# Patient Record
Sex: Male | Born: 1980
Health system: Southern US, Community
[De-identification: ages and names within clinical notes are randomized; demographics above are authoritative.]

## PROBLEM LIST (undated history)

## (undated) DIAGNOSIS — Z87828 Personal history of other (healed) physical injury and trauma: Secondary | ICD-10-CM

## (undated) DIAGNOSIS — Z8669 Personal history of other diseases of the nervous system and sense organs: Secondary | ICD-10-CM

## (undated) HISTORY — DX: Personal history of other diseases of the nervous system and sense organs: Z86.69

## (undated) HISTORY — DX: Personal history of other (healed) physical injury and trauma: Z87.828

## (undated) HISTORY — PX: BACK SURGERY: SHX140

---

## 2020-02-11 ENCOUNTER — Encounter: Payer: Self-pay | Admitting: Physical Medicine & Rehabilitation

## 2020-03-07 ENCOUNTER — Encounter
Payer: No Typology Code available for payment source | Attending: Physical Medicine & Rehabilitation | Admitting: Physical Medicine & Rehabilitation

## 2020-10-03 ENCOUNTER — Observation Stay (HOSPITAL_COMMUNITY)
Admission: EM | Admit: 2020-10-03 | Discharge: 2020-10-04 | Disposition: A | Discharge status: Home / Self Care | Payer: No Typology Code available for payment source | Attending: Cardiology | Admitting: Cardiology

## 2020-10-03 ENCOUNTER — Emergency Department (HOSPITAL_COMMUNITY): Payer: No Typology Code available for payment source

## 2020-10-03 ENCOUNTER — Ambulatory Visit (HOSPITAL_COMMUNITY)
Admission: EM | Admit: 2020-10-03 | Discharge: 2020-10-03 | Disposition: A | Discharge status: Home / Self Care | Payer: No Typology Code available for payment source | Attending: Family Medicine | Admitting: Family Medicine

## 2020-10-03 ENCOUNTER — Other Ambulatory Visit: Payer: Self-pay

## 2020-10-03 ENCOUNTER — Encounter (HOSPITAL_COMMUNITY): Payer: Self-pay | Admitting: Cardiology

## 2020-10-03 DIAGNOSIS — I4891 Unspecified atrial fibrillation: Secondary | ICD-10-CM

## 2020-10-03 DIAGNOSIS — E785 Hyperlipidemia, unspecified: Secondary | ICD-10-CM | POA: Diagnosis not present

## 2020-10-03 DIAGNOSIS — Z20822 Contact with and (suspected) exposure to covid-19: Secondary | ICD-10-CM | POA: Diagnosis not present

## 2020-10-03 DIAGNOSIS — R0789 Other chest pain: Secondary | ICD-10-CM | POA: Diagnosis not present

## 2020-10-03 DIAGNOSIS — R002 Palpitations: Secondary | ICD-10-CM | POA: Diagnosis present

## 2020-10-03 DIAGNOSIS — I480 Paroxysmal atrial fibrillation: Secondary | ICD-10-CM | POA: Insufficient documentation

## 2020-10-03 LAB — BASIC METABOLIC PANEL
Anion gap: 8 (ref 5–15)
BUN: 9 mg/dL (ref 6–20)
CO2: 28 mmol/L (ref 22–32)
Calcium: 9.4 mg/dL (ref 8.9–10.3)
Chloride: 104 mmol/L (ref 98–111)
Creatinine, Ser: 1 mg/dL (ref 0.61–1.24)
GFR, Estimated: >60 mL/min (ref >60)
Glucose, Bld: 95 mg/dL (ref 70–99)
Potassium: 4.1 mmol/L (ref 3.5–5.1)
Sodium: 140 mmol/L (ref 135–145)

## 2020-10-03 LAB — CBC
HCT: 51.4 % (ref 39.0–52.0)
Hemoglobin: 18.1 g/dL — ABNORMAL HIGH (ref 13.0–17.0)
MCH: 29.1 pg (ref 26.0–34.0)
MCHC: 35.2 g/dL (ref 30.0–36.0)
MCV: 82.6 fL (ref 80.0–100.0)
Platelets: 244 10*3/uL (ref 150–400)
RBC: 6.22 MIL/uL — ABNORMAL HIGH (ref 4.22–5.81)
RDW: 13.2 % (ref 11.5–15.5)
WBC: 10.7 10*3/uL — ABNORMAL HIGH (ref 4.0–10.5)
nRBC: 0 % (ref 0.0–0.2)

## 2020-10-03 LAB — RESP PANEL BY RT-PCR (FLU A&B, COVID) ARPGX2
Influenza A by PCR: NEGATIVE
Influenza B by PCR: NEGATIVE
SARS Coronavirus 2 by RT PCR: NEGATIVE

## 2020-10-03 LAB — MAGNESIUM: Magnesium: 2.2 mg/dL (ref 1.7–2.4)

## 2020-10-03 LAB — HIV ANTIBODY (ROUTINE TESTING W REFLEX): HIV Screen 4th Generation wRfx: NONREACTIVE

## 2020-10-03 LAB — TSH: TSH: 1.348 u[IU]/mL (ref 0.350–4.500)

## 2020-10-03 MED ORDER — DILTIAZEM HCL-DEXTROSE 125-5 MG/125ML-% IV SOLN (PREMIX)
5.0000 mg/h | INTRAVENOUS | Status: DC
  Administered 2020-10-03: 5 mg/h via INTRAVENOUS
  Filled 2020-10-03: qty 125

## 2020-10-03 MED ORDER — DILTIAZEM HCL 25 MG/5ML IV SOLN
10.0000 mg | Freq: Once | INTRAVENOUS | Status: AC
  Administered 2020-10-03: 10 mg via INTRAVENOUS
  Filled 2020-10-03: qty 5

## 2020-10-03 MED ORDER — ACETAMINOPHEN 325 MG PO TABS: 650.0000 mg | ORAL_TABLET | ORAL | Status: DC | PRN

## 2020-10-03 MED ORDER — APIXABAN 5 MG PO TABS: 5.0000 mg | ORAL_TABLET | Freq: Two times a day (BID) | ORAL | Status: DC

## 2020-10-03 MED ORDER — DILTIAZEM LOAD VIA INFUSION
10.0000 mg | Freq: Once | INTRAVENOUS | Status: AC
  Administered 2020-10-03: 10 mg via INTRAVENOUS
  Filled 2020-10-03: qty 10

## 2020-10-03 MED ORDER — APIXABAN 5 MG PO TABS
10.0000 mg | ORAL_TABLET | Freq: Once | ORAL | Status: AC
  Administered 2020-10-03: 10 mg via ORAL
  Filled 2020-10-03: qty 2

## 2020-10-03 MED ORDER — ONDANSETRON HCL 4 MG/2ML IJ SOLN: 4.0000 mg | Freq: Four times a day (QID) | INTRAMUSCULAR | Status: DC | PRN

## 2020-10-03 MED ORDER — APIXABAN 5 MG PO TABS
5.0000 mg | ORAL_TABLET | Freq: Two times a day (BID) | ORAL | Status: DC
  Administered 2020-10-04: 5 mg via ORAL
  Filled 2020-10-03: qty 1

## 2020-10-03 NOTE — ED Triage Notes (Signed)
Pt presents today with c/o of chest tightness with palpitations that began this morning after a coughing episode. Denies n/v/d or diaphoresis.

## 2020-10-03 NOTE — ED Triage Notes (Signed)
C/o chest tightness and palpitations; reported hx of Afib but no medication,

## 2020-10-03 NOTE — ED Provider Notes (Signed)
  [facility]   [id number] 10/03/20 Arrival Time: 1028  ASSESSMENT & PLAN:  1. Atrial fibrillation, new onset (HCC)   2. Chest discomfort     Discharge to ED for further investigations/treatment. Via private vehicle. Stable upon discharge.  ECG: Performed today and interpreted by me: A.Fib with RVR.  Reviewed expectations re: course of current medical issues. Questions answered. Outlined signs and symptoms indicating need for more acute intervention. Patient verbalized understanding. After Visit Summary given.   SUBJECTIVE:  History from: patient. Jimmy Woods is a 40 y.o. male who reports palpitations with chest discomfort described as "a tight feeling". Onset around 0700 today. Intermittent symptoms. Questions some SOB with palpitations. No h/o similar. Parents "with some mild heart problems." Non-smoker. No freq alcohol use. No street drug use. Ambulatory without difficulty. Drove himself here. Denies: orthopnea, paroxysmal nocturnal dyspnea and syncope. Aggravating factors: have not been identified. Alleviating factors: have not been identified. Recent illnesses: none. Fever: absent. No new medications. Self/OTC treatment: none. No LE edema.  OBJECTIVE:  Vitals:   10/03/20 1057 10/03/20 1111  Pulse:  (!) 143  Resp: 18 20  SpO2: 97%     General appearance: alert, oriented, no acute distress Eyes: PERRLA; EOMI; conjunctivae normal HENT: normocephalic; atraumatic Neck: supple with FROM Lungs: without labored respirations; speaks full sentences without difficulty; CTAB Heart: irregularly irregular Chest Wall: without tenderness to palpation Abdomen: soft, non-tender; no guarding or rebound tenderness Extremities: without edema; without calf swelling or tenderness; symmetrical without gross deformities Skin: warm and dry; without rash or lesions Neuro: normal gait Psychological: alert and cooperative; normal mood and affect   NKDA reported.  No  past medical history on file. Social History   Socioeconomic History  . Marital status: Married    Spouse name: Not on file  . Number of children: Not on file  . Years of education: Not on file  . Highest education level: Not on file  Occupational History  . Not on file  Tobacco Use  . Smoking status: Not on file  . Smokeless tobacco: Not on file  Substance and Sexual Activity  . Alcohol use: Not on file  . Drug use: Not on file  . Sexual activity: Not on file  Other Topics Concern  . Not on file  Social History Narrative  . Not on file   Social Determinants of Health   Financial Resource Strain: Not on file  Food Insecurity: Not on file  Transportation Needs: Not on file  Physical Activity: Not on file  Stress: Not on file  Social Connections: Not on file  Intimate Partner Violence: Not on file   No family history on file.    Mardella Layman, MD 10/03/20 1116

## 2020-10-03 NOTE — ED Provider Notes (Signed)
[facility] EMERGENCY DEPARTMENT Provider Note   CSN: [id number] Arrival date & time: 10/03/20  1119     History Chief Complaint  Patient presents with  . Palpitations    Jimmy Woods is a 40 y.o. male.  HPI 40 year old male with no significant medical history presents to the ER from urgent care with complaints of A. fib with RVR, new onset.  Patient has no history of this in the past.  Denies any history of alcohol use, drug use, energy drinks.  Denies any smoking.  He states that he felt a little bit "off" yesterday, had 1 episode of vomiting this morning, but did feel the initiation of palpitations at around 6:30 AM this morning.  No family history of A. fib.  He was seen at urgent care and was found to be in A. fib with RVR with a rate of 143.  He was sent to the ER.  He states he was the palpitations, but is otherwise asymptomatic, no shortness of breath, no dizziness.    History reviewed. No pertinent past medical history.  There are no problems to display for this patient.    The histories are not reviewed yet. Please review them in the "History" navigator section and refresh this SmartLink.     Family History  Problem Relation Age of Onset  . Heart attack Father        Home Medications Prior to Admission medications   Medication Sig Start Date End Date Taking? Authorizing Provider  FLUoxetine (PROZAC) 10 MG capsule Take 10 mg by mouth daily. 09/08/20 09/09/21 Yes [provider]  melatonin 3 MG TABS tablet Take 3 mg by mouth at bedtime as needed for sleep. 09/08/20  Yes [provider]  SUMAtriptan (IMITREX) 20 MG/ACT nasal spray Place 20 mg into the nose every 2 (two) hours as needed for migraine. SPRAY 1 SPRAY INTO NOSTRIL TWICE A DAY AS NEEDED AS DIRECTED FOR MIGRAINE RELIEF 04/12/20  Yes [provider]    Allergies    Patient has no known allergies.  Review of Systems   Review of Systems  Constitutional:  Negative for chills and fever.  HENT: Negative for ear pain and sore throat.   Eyes: Negative for pain and visual disturbance.  Respiratory: Negative for cough and shortness of breath.   Cardiovascular: Positive for palpitations. Negative for chest pain.  Gastrointestinal: Negative for abdominal pain and vomiting.  Genitourinary: Negative for dysuria and hematuria.  Musculoskeletal: Negative for arthralgias and back pain.  Skin: Negative for color change and rash.  Neurological: Negative for seizures and syncope.  All other systems reviewed and are negative.   Physical Exam Updated Vital Signs BP (!) 115/96   Pulse 84   Temp 98.2 F (36.8 C)   Resp 10   SpO2 99%   Physical Exam Vitals and nursing note reviewed.  Constitutional:      General: He is not in acute distress.    Appearance: He is well-developed. He is not ill-appearing, toxic-appearing or diaphoretic.  HENT:     Head: Normocephalic and atraumatic.  Eyes:     Conjunctiva/sclera: Conjunctivae normal.  Cardiovascular:     Rate and Rhythm: Tachycardia present. Rhythm irregular.     Heart sounds: No murmur heard.   Pulmonary:     Effort: Pulmonary effort is normal. No respiratory distress.     Breath sounds: Normal breath sounds.  Abdominal:     Palpations: Abdomen is soft.  Tenderness: There is no abdominal tenderness.  Musculoskeletal:        General: Normal range of motion.     Cervical back: Neck supple.  Skin:    General: Skin is warm and dry.  Neurological:     General: No focal deficit present.     Mental Status: He is alert and oriented to person, place, and time.     ED Results / Procedures / Treatments   Labs (all labs ordered are listed, but only abnormal results are displayed) Labs Reviewed  CBC - Abnormal; Notable for the following components:      Result Value   WBC 10.7 (*)    RBC 6.22 (*)    Hemoglobin 18.1 (*)    All other components within normal limits  RESP PANEL BY RT-PCR  (FLU A&B, COVID) ARPGX2  BASIC METABOLIC PANEL  MAGNESIUM    EKG EKG Interpretation  Date/Time:  Tuesday October 03 2020 11:37:38 EDT Ventricular Rate:  131 PR Interval:    QRS Duration: 88 QT Interval:  286 QTC Calculation: 422 R Axis:   68 Text Interpretation: Atrial fibrillation with rapid ventricular response Abnormal ECG Confirmed by Vanetta Mulders [phone]) on 10/03/2020 1:02:45 PM   Radiology DG Chest 2 View  Result Date: 10/03/2020 CLINICAL DATA:  Chest pain EXAM: CHEST - 2 VIEW COMPARISON:  None. FINDINGS: Lungs are clear.  No pleural effusion or pneumothorax. The heart is normal in size. Visualized osseous structures are within normal limits. IMPRESSION: Normal chest radiographs. Electronically Signed   By: Charline Bills M.D.   On: 10/03/2020 12:32    Procedures Procedures   Medications Ordered in ED Medications  diltiazem (CARDIZEM) injection 10 mg (10 mg Intravenous Given 10/03/20 1337)    ED Course  I have reviewed the triage vital signs and the nursing notes.  Pertinent labs & imaging results that were available during my care of the patient were reviewed by me and considered in my medical decision making (see chart for details).    MDM Rules/Calculators/A&P                          40 year old male with new onset A. fib with RVR.  Originally arrived with a rate between the 130s/140s.  He is otherwise asymptomatic outside of sensations of palpitations.  His BMP is without any significant electrode abnormalities, CBC with a mild leukocytosis of 10.7, normal magnesium.  Chest x-ray without acute abnormalities.  Patient was given 10 mg of IV diltiazem, rate did improve to the mid 80s, however shortly thereafter he returned to RVR with a rate in the 140s again.  Consulted cardiology who saw and evaluated the patient.  Dr. Anne Fu would like the patient to be admitted for TEE.  They will place orders.  Cardiology will admit.  Covid test ordered.  Stable for  admission at this time.   Final Clinical Impression(s) / ED Diagnoses Final diagnoses:  Atrial fibrillation with RVR (HCC)  New onset atrial fibrillation [facility])    Rx / DC Orders ED Discharge Orders    None       Leone Brand 10/03/20 1626    Vanetta Mulders, MD 10/07/20 [phone]
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
## 2020-10-04 ENCOUNTER — Other Ambulatory Visit: Payer: Self-pay | Admitting: Medical

## 2020-10-04 ENCOUNTER — Telehealth: Payer: Self-pay | Admitting: Physician Assistant

## 2020-10-04 ENCOUNTER — Encounter (HOSPITAL_COMMUNITY): Payer: Self-pay | Admitting: Cardiology

## 2020-10-04 DIAGNOSIS — I480 Paroxysmal atrial fibrillation: Secondary | ICD-10-CM

## 2020-10-04 DIAGNOSIS — E785 Hyperlipidemia, unspecified: Secondary | ICD-10-CM | POA: Diagnosis not present

## 2020-10-04 DIAGNOSIS — I4891 Unspecified atrial fibrillation: Secondary | ICD-10-CM

## 2020-10-04 DIAGNOSIS — Z20822 Contact with and (suspected) exposure to covid-19: Secondary | ICD-10-CM | POA: Diagnosis not present

## 2020-10-04 HISTORY — DX: Unspecified atrial fibrillation: I48.91

## 2020-10-04 HISTORY — DX: Paroxysmal atrial fibrillation: I48.0

## 2020-10-04 LAB — CBC
HCT: 53.5 % — ABNORMAL HIGH (ref 39.0–52.0)
Hemoglobin: 18.2 g/dL — ABNORMAL HIGH (ref 13.0–17.0)
MCH: 28.7 pg (ref 26.0–34.0)
MCHC: 34 g/dL (ref 30.0–36.0)
MCV: 84.4 fL (ref 80.0–100.0)
Platelets: 229 K/uL (ref 150–400)
RBC: 6.34 MIL/uL — ABNORMAL HIGH (ref 4.22–5.81)
RDW: 13.1 % (ref 11.5–15.5)
WBC: 9.7 K/uL (ref 4.0–10.5)
nRBC: 0 % (ref 0.0–0.2)

## 2020-10-04 LAB — BASIC METABOLIC PANEL WITH GFR
Anion gap: 6 (ref 5–15)
BUN: 14 mg/dL (ref 6–20)
CO2: 28 mmol/L (ref 22–32)
Calcium: 9.1 mg/dL (ref 8.9–10.3)
Chloride: 103 mmol/L (ref 98–111)
Creatinine, Ser: 1.14 mg/dL (ref 0.61–1.24)
GFR, Estimated: >60 mL/min
Glucose, Bld: 102 mg/dL — ABNORMAL HIGH (ref 70–99)
Potassium: 4.2 mmol/L (ref 3.5–5.1)
Sodium: 137 mmol/L (ref 135–145)

## 2020-10-04 LAB — LIPID PANEL
Cholesterol: 190 mg/dL (ref 0–200)
HDL: 30 mg/dL — ABNORMAL LOW (ref >40)
LDL Cholesterol: 122 mg/dL — ABNORMAL HIGH (ref 0–99)
Total CHOL/HDL Ratio: 6.3 RATIO
Triglycerides: 190 mg/dL — ABNORMAL HIGH (ref <150)
VLDL: 38 mg/dL (ref 0–40)

## 2020-10-04 LAB — HEMOGLOBIN A1C
Hgb A1c MFr Bld: 5.1 % (ref 4.8–5.6)
Mean Plasma Glucose: 99.67 mg/dL

## 2020-10-04 MED ORDER — DILTIAZEM HCL ER COATED BEADS 120 MG PO CP24: 120.0000 mg | ORAL_CAPSULE | Freq: Every day | ORAL | 3 refills | Status: AC

## 2020-10-04 MED ORDER — DILTIAZEM HCL ER COATED BEADS 120 MG PO CP24
120.0000 mg | ORAL_CAPSULE | Freq: Every day | ORAL | Status: DC
  Administered 2020-10-04: 120 mg via ORAL
  Filled 2020-10-04: qty 1

## 2020-10-04 MED ORDER — APIXABAN 5 MG PO TABS: 5.0000 mg | ORAL_TABLET | Freq: Two times a day (BID) | ORAL | 6 refills | Status: DC

## 2020-10-04 MED FILL — ELIQUIS 5 MG TABLET: 5 | 30 days supply | Qty: 60 | Fill #0

## 2020-10-04 MED FILL — CARTIA XT 120 MG CP24: 120 | 30 days supply | Qty: 30 | Fill #0

## 2020-10-04 NOTE — Discharge Summary (Addendum)
Discharge Summary    Patient ID: Jimmy Woods MRN: [id number]; DOB: 1980/10/23  Admit date: 10/03/2020 Discharge date: 10/04/2020  PCP:  Wallace Cullens, MD   [facility]  Cardiologist:  Donato Schultz, MD  Advanced Practice Provider:  No care team member to display Electrophysiologist:  None   Discharge Diagnoses    Active Problems:   Atrial fibrillation (HCC)   Paroxysmal A-fib [facility])    Diagnostic Studies/Procedures    None _____________   History of Present Illness     Jimmy Woods is a 40 y.o. 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 He denies history of coronary disease or arrhtyhmias. He has never been seen by cardiology in the past. Denies any family hx of arrhythmias but says he thinks father had a MI but both parents are heavy smokers and did not see PCP on regular basis. He is married with several children. Active at work and home. Woke up on the morning of 10/03/20 in his usual state of health. 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 He then developed sudden onset palpitations and weakness. He had an episode of nausea and bile colored emesis. He reported ongoing weakness but otherwise did not feel super concerned over symptoms and proceeded to go to work. He reported that he "just didn't feel right" and eventually presented to urgent care where he was found to have new onset atrial fibrillation and was referred to [facility] for further evaluation.   In the ED his labs showed stable electrolytes, Cr 1.0, Mag 2.2, WBC 10.7, Hgb 18.1. Negative CXR. EKG interpretation shows atrial fibrillation with RVR, but does seem possible to be atrial flutter as R-R interval are regular. Review of telemetry does show atrial fibrillation.  Cardiology consulted for further management. Of note he denies any hx of tobacco use, ETOH/drug use. No concerns of OSA  per wife at the bedside. Very minimal amounts of caffeine, and denies any energy drinks or diet medications.    Hospital Course     Consultants: None   1. New onset atrial fibrillation: patient presented with sudden onset palpitations and weakness with once episode of N/V. Symptoms persisted and he presented to urgent care where he was found to be in new onset atrial fibrillation and was subsequently referred to [facility] ED for further evaluation. EKG on arrival showed atrial fibrillation with HR 132 bpm. Etiology remains unclear as there were no obvious inciting factors - non-smoker, no excess caffeine/ETOH use, no anginal complaints, no symptoms c/f OSA, TSH wnl. He was started on a diltiazem gtt with conversion to sinus rhythm overnight. He was transitioned to po diltiazem for rate control going forward. He was started on eliquis in anticipation of possible need for TEE/DCCV. Plan to continue eliquis 5mg  BID x1 month given autoconversion, then can discontinue given CHA2DS2-VASc Score = 0 [CHF History: No, HTN History: No, Diabetes History: No, Stroke History: No, Vascular Disease History: No, Age Score: 0, Gender Score: 0].  (Therefore, the patient's annual risk of stroke is 0.2 %). - Continue eliquis 5mg  BID for stroke ppx x1 month, then patient cleared to stop. - Continue diltiazem 120mg  daily for rate control    2. HLD: LDL elevated to 122 this admission. Goal <100 for prevention of heart disease.  - Encouraged dietary/lifestyle modifications to lower cholesterol - Low threshold to start statin if LDL remains elevated  on next check.     Did the patient have an acute coronary syndrome (MI, NSTEMI, STEMI, etc) this admission?:  No                               Did the patient have a percutaneous coronary intervention (stent / angioplasty)?:  No.       _____________  Discharge Vitals Blood pressure 126/75, pulse 93, temperature 98.3 F (36.8 C), temperature source Oral, resp. rate 20, height  5\' 7"  (1.702 m), SpO2 98 %.  There were no vitals filed for this visit.  Labs & Radiologic Studies    CBC Recent Labs    10/03/20 1151 10/04/20 0223  WBC 10.7* 9.7  HGB 18.1* 18.2*  HCT 51.4 53.5*  MCV 82.6 84.4  PLT 244 229   Basic Metabolic Panel Recent Labs    10/06/20 1151 10/03/20 1314 10/04/20 0223  NA 140  --  137  K 4.1  --  4.2  CL 104  --  103  CO2 28  --  28  GLUCOSE 95  --  102*  BUN 9  --  14  CREATININE 1.00  --  1.14  CALCIUM 9.4  --  9.1  MG  --  2.2  --    Liver Function Tests No results for input(s): AST, ALT, ALKPHOS, BILITOT, PROT, ALBUMIN in the last 72 hours. No results for input(s): LIPASE, AMYLASE in the last 72 hours. High Sensitivity Troponin:   No results for input(s): TROPONINIHS in the last 720 hours.  BNP Invalid input(s): POCBNP D-Dimer No results for input(s): DDIMER in the last 72 hours. Hemoglobin A1C Recent Labs    10/04/20 0223  HGBA1C 5.1   Fasting Lipid Panel Recent Labs    10/04/20 0223  CHOL 190  HDL 30*  LDLCALC 122*  TRIG 190*  CHOLHDL 6.3   Thyroid Function Tests Recent Labs    10/03/20 2200  TSH 1.348   _____________  DG Chest 2 View  Result Date: 10/03/2020 CLINICAL DATA:  Chest pain EXAM: CHEST - 2 VIEW COMPARISON:  None. FINDINGS: Lungs are clear.  No pleural effusion or pneumothorax. The heart is normal in size. Visualized osseous structures are within normal limits. IMPRESSION: Normal chest radiographs. Electronically Signed   By: 10/05/2020 M.D.   On: 10/03/2020 12:32   Disposition   Pt is being discharged home today in good condition.  Follow-up Plans & Appointments     Follow-up Information    10/05/2020, PA-C Follow up on 11/07/2020.   Specialties: Cardiology, Physician Assistant Why: 8:45 Contact information: [address] [place] [phone]                Discharge Medications   Allergies as of 10/04/2020   No Known  Allergies     Medication List    TAKE these medications   apixaban 5 MG Tabs tablet Commonly known as: ELIQUIS Take 1 tablet (5 mg total) by mouth 2 (two) times daily.   diltiazem 120 MG 24 hr capsule Commonly known as: CARDIZEM CD Take 1 capsule (120 mg total) by mouth daily. Start taking on: October 05, 2020   FLUoxetine 10 MG capsule Commonly known as: PROZAC Take 10 mg by mouth daily.   melatonin 3 MG Tabs tablet Take 3 mg by mouth at bedtime as needed for sleep.   SUMAtriptan 20 MG/ACT nasal spray  Commonly known as: IMITREX Place 20 mg into the nose every 2 (two) hours as needed for migraine. SPRAY 1 SPRAY INTO NOSTRIL TWICE A DAY AS NEEDED AS DIRECTED FOR MIGRAINE RELIEF          Outstanding Labs/Studies   Echo ordered to be completed outpatient.  Duration of Discharge Encounter   Greater than 30 minutes including physician time.  Signed, Beatriz Stallion, PA-C 10/04/2020, 12:07 PM  Personally seen and examined. Agree with above.    Subjective   Converted to normal sinus rhythm.  Feels well.  Inpatient Medications    Scheduled Meds: . apixaban  5 mg Oral BID   Continuous Infusions: . diltiazem (CARDIZEM) infusion 5 mg/hr (10/04/20 0424)   PRN Meds: acetaminophen, ondansetron (ZOFRAN) IV   Vital Signs          Vitals:   10/04/20 0415 10/04/20 0515 10/04/20 0614 10/04/20 0747  BP: 104/69 104/71 114/82 119/74  Pulse: 68 70 88 76  Resp: 13 12 19 15   Temp:   97.7 F (36.5 C) 98.3 F (36.8 C)  TempSrc:   Oral Oral  SpO2: 96% 96% 97% 98%  Height:   5\' 7"  (1.702 m)    No intake or output data in the 24 hours ending 10/04/20 1025 No flowsheet data found.    Telemetry    Normal sinus rhythm- Personally Reviewed  ECG    Normal sinus rhythm- Personally Reviewed  Physical Exam   GEN:No acute distress.   Neck:No JVD Cardiac:RRR, no murmurs, rubs, or gallops.  Respiratory:Clear to auscultation  bilaterally. , nontender, non-distended  MS:No edema; No deformity. Neuro:Nonfocal  Psych: Normal affect   Labs    High Sensitivity Troponin:   Last Labs   No results for input(s): TROPONINIHS in the last 720 hours.      Chemistry Last Labs       Recent Labs  Lab 10/03/20 1151 10/04/20 0223  NA 140 137  K 4.1 4.2  CL 104 103  CO2 28 28  GLUCOSE 95 102*  BUN 9 14  CREATININE 1.00 1.14  CALCIUM 9.4 9.1  GFRNONAA >60 >60  ANIONGAP 8 6       Hematology Last Labs       Recent Labs  Lab 10/03/20 1151 10/04/20 0223  WBC 10.7* 9.7  RBC 6.22* 6.34*  HGB 18.1* 18.2*  HCT 51.4 53.5*  MCV 82.6 84.4  MCH 29.1 28.7  MCHC 35.2 34.0  RDW 13.2 13.1  PLT 244 229      BNP Last Labs   No results for input(s): BNP, PROBNP in the last 168 hours.     DDimer  Last Labs   No results for input(s): DDIMER in the last 168 hours.     Radiology     Imaging Results (Last 48 hours)  DG Chest 2 View  Result Date: 10/03/2020 CLINICAL DATA:  Chest pain EXAM: CHEST - 2 VIEW COMPARISON:  None. FINDINGS: Lungs are clear.  No pleural effusion or pneumothorax. The heart is normal in size. Visualized osseous structures are within normal limits. IMPRESSION: Normal chest radiographs. Electronically Signed   By: 10/06/20 M.D.   On: 10/03/2020 12:32     Cardiac Studies   ECHO pending as outpatient  Patient Profile     40 y.o. male here with paroxysmal atrial fibrillation, new onset  Assessment & Plan    Paroxysmal atrial fibrillation -No obvious inciting factors.  Lone atrial fibrillation. -I will go ahead and  place him on diltiazem CD 120 mg once a day.  We will see if we can continue that longstanding.  If he does have another episode of atrial fibrillation at home, he can take an additional tablet. -I will also give him Eliquis for 4 weeks only for 30 days given his recent auto conversion.  Perhaps a 30-day co-pay card may be helpful for  him. -We will order an echocardiogram as an outpatient. -If atrial fibrillation returns or becomes more frequent, EP consultation for potential ablation.  Okay to discharge. Donato SchultzMark Kamaya Keckler, MD   For questions or updates, please contact [facility] Please consult [email or web address] for contact info under        Signed, Donato SchultzMark Eutha Cude, MD

## 2020-10-04 NOTE — Progress Notes (Signed)
Patient given discharge information. Telemetry box removed, CCMD notified. PIV removed. Patient taken to vehicle by volunteers. Patients personal belongings packed up and taken with patient. Pharmacy delivered medications to patient. Patient took medications with them to vehicle.  Kenard Gower, RN

## 2020-10-04 NOTE — Telephone Encounter (Signed)
    Pt has TOC with Tereso Newcomer on 11/07/20 8:45 am. Scheduled by Blima Rich

## 2020-10-04 NOTE — Progress Notes (Signed)
Progress Note  Patient Name: Kamdon Reisig Barbero Date of Encounter: 10/04/2020  [facility] Cardiologist: Donato Schultz, MD   Subjective   Converted to normal sinus rhythm.  Feels well.  Inpatient Medications    Scheduled Meds: . apixaban  5 mg Oral BID   Continuous Infusions: . diltiazem (CARDIZEM) infusion 5 mg/hr (10/04/20 0424)   PRN Meds: acetaminophen, ondansetron (ZOFRAN) IV   Vital Signs    Vitals:   10/04/20 0415 10/04/20 0515 10/04/20 0614 10/04/20 0747  BP: 104/69 104/71 114/82 119/74  Pulse: 68 70 88 76  Resp: 13 12 19 15   Temp:   97.7 F (36.5 C) 98.3 F (36.8 C)  TempSrc:   Oral Oral  SpO2: 96% 96% 97% 98%  Height:   5\' 7"  (1.702 m)    No intake or output data in the 24 hours ending 10/04/20 1025 No flowsheet data found.    Telemetry    Normal sinus rhythm- Personally Reviewed  ECG    Normal sinus rhythm- Personally Reviewed  Physical Exam   GEN: No acute distress.   Neck: No JVD Cardiac: RRR, no murmurs, rubs, or gallops.  Respiratory: Clear to auscultation bilaterally. GI: Soft, nontender, non-distended  MS: No edema; No deformity. Neuro:  Nonfocal  Psych: Normal affect   Labs    High Sensitivity Troponin:  No results for input(s): TROPONINIHS in the last 720 hours.    Chemistry Recent Labs  Lab 10/03/20 1151 10/04/20 0223  NA 140 137  K 4.1 4.2  CL 104 103  CO2 28 28  GLUCOSE 95 102*  BUN 9 14  CREATININE 1.00 1.14  CALCIUM 9.4 9.1  GFRNONAA >60 >60  ANIONGAP 8 6     Hematology Recent Labs  Lab 10/03/20 1151 10/04/20 0223  WBC 10.7* 9.7  RBC 6.22* 6.34*  HGB 18.1* 18.2*  HCT 51.4 53.5*  MCV 82.6 84.4  MCH 29.1 28.7  MCHC 35.2 34.0  RDW 13.2 13.1  PLT 244 229    BNPNo results for input(s): BNP, PROBNP in the last 168 hours.   DDimer No results for input(s): DDIMER in the last 168 hours.   Radiology    DG Chest 2 View  Result Date: 10/03/2020 CLINICAL DATA:  Chest pain EXAM: CHEST - 2 VIEW  COMPARISON:  None. FINDINGS: Lungs are clear.  No pleural effusion or pneumothorax. The heart is normal in size. Visualized osseous structures are within normal limits. IMPRESSION: Normal chest radiographs. Electronically Signed   By: 10/06/20 M.D.   On: 10/03/2020 12:32    Cardiac Studies   ECHO pending as outpatient  Patient Profile     40 y.o. male here with paroxysmal atrial fibrillation, new onset  Assessment & Plan    Paroxysmal atrial fibrillation -No obvious inciting factors.  Lone atrial fibrillation. -I will go ahead and place him on diltiazem CD 120 mg once a day.  We will see if we can continue that longstanding.  If he does have another episode of atrial fibrillation at home, he can take an additional tablet. -I will also give him Eliquis for 4 weeks only for 30 days given his recent auto conversion.  Perhaps a 30-day co-pay card may be helpful for him. -We will order an echocardiogram as an outpatient. -If atrial fibrillation returns or becomes more frequent, EP consultation for potential ablation.  Okay to discharge. 10/05/2020, MD   For questions or updates, please contact [facility] Please consult [email or web address] for contact info  under        Signed, Donato Schultz, MD  10/04/2020, 10:25 AM

## 2020-10-04 NOTE — TOC Benefit Eligibility Note (Signed)
Patient Product/process development scientist completed.    The patient is currently admitted and upon discharge could be taking Eliquis 5 mg.  Requires Prior Authorization  The patient is insured through [employer]   Roland Earl, CPhT Pharmacy Patient Advocate Specialist [facility] Antimicrobial Stewardship Team Direct Number: [phone]  Fax: [phone]

## 2020-10-04 NOTE — Discharge Instructions (Addendum)
Please continue your eliquis for 1 month, after which we will plan to stop this medication.  Your cholesterol levels were mildly elevated on your labs this hospital stay. Your LDL (bad cholesterol) was elevated to 122. In an effort to prevent heart disease down the road, we shoot for an LDL level <100. Please try to increase your activity level with regular exercise (30 minutes per day) and eat a heart healthy diet. You can refer to the "High cholesterol" information included in your discharge paperwork for more information. If your cholesterol level remains elevated on your next check, I would recommend starting a medication to lower your cholesterol.   Information on my medicine - ELIQUIS (apixaban)  Why was Eliquis prescribed for you? Eliquis was prescribed for you to reduce the risk of a blood clot forming that can cause a stroke if you have a medical condition called atrial fibrillation (a type of irregular heartbeat).  What do You need to know about Eliquis ? Take your Eliquis TWICE DAILY - one tablet in the morning and one tablet in the evening with or without food. If you have difficulty swallowing the tablet whole please discuss with your pharmacist how to take the medication safely.  Take Eliquis exactly as prescribed by your doctor and DO NOT stop taking Eliquis without talking to the doctor who prescribed the medication.  Stopping may increase your risk of developing a stroke.  Refill your prescription before you run out.  After discharge, you should have regular check-up appointments with your healthcare provider that is prescribing your Eliquis.  In the future your dose may need to be changed if your kidney function or weight changes by a significant amount or as you get older.  What do you do if you miss a dose? If you miss a dose, take it as soon as you remember on the same day and resume taking twice daily.  Do not take more than one dose of ELIQUIS at the same time to make  up a missed dose.  Important Safety Information A possible side effect of Eliquis is bleeding. You should call your healthcare provider right away if you experience any of the following: ? Bleeding from an injury or your nose that does not stop. ? Unusual colored urine (red or dark brown) or unusual colored stools (red or black). ? Unusual bruising for unknown reasons. ? A serious fall or if you hit your head (even if there is no bleeding).  Some medicines may interact with Eliquis and might increase your risk of bleeding or clotting while on Eliquis. To help avoid this, consult your healthcare provider or pharmacist prior to using any new prescription or non-prescription medications, including herbals, vitamins, non-steroidal anti-inflammatory drugs (NSAIDs) and supplements.  This website has more information on Eliquis (apixaban): http://www.eliquis.com/eliquis/home

## 2020-10-04 NOTE — Progress Notes (Signed)
Pt arrived to 4E01 from ED. CHG bath completed and new gown applied. Tele applied and CCMD notified. VS stable. Pt oriented to room, bed and call bell. Will continue to monitor.  Willa Frater, RN

## 2020-10-05 SURGERY — ECHOCARDIOGRAM, TRANSESOPHAGEAL
Anesthesia: Monitor Anesthesia Care

## 2020-10-05 NOTE — Telephone Encounter (Signed)
**Note De-Identified Jimmy Woods Obfuscation** Patient contacted regarding discharge from [facility] on 10/04/2020.  Patient understands to follow up with provider Tereso Newcomer, PA-c on 11/07/2020 at 8:45 at [address], [address] in [place], [place] [address]. Patient understands discharge instructions? Yes Patient understands medications and regiment? Yes Patient understands to bring all medications to this visit? Yes  Ask patient:  Are you enrolled in [facility]: Not yet but he states he plans to very soon and he feels he will not need help enrolling.  The pt states that he is doing well at this time and that he has had no more atrial fibrillation that he is aware of. He denies CP/discomfort, elevated HR, SOB, dizziness, lightheadedness, syncope, nausea, or diaphoresis.  He does have [facility]'s phone number and is aware to call us if he has any questions or concerns. He thanked me for my call.

## 2020-10-20 ENCOUNTER — Telehealth: Payer: Self-pay | Admitting: Pharmacist

## 2020-10-20 NOTE — Telephone Encounter (Signed)
Eliquis prior authorization submitted. Will also need rx sent to outpatient pharmacy of choice once approved, current rx just sent to [facility] pharmacy.

## 2020-10-23 NOTE — Telephone Encounter (Signed)
Prior authorization denied, appeals submitted.

## 2020-10-26 MED ORDER — APIXABAN 5 MG PO TABS: 1.0000 | ORAL_TABLET | Freq: Two times a day (BID) | ORAL | 5 refills | Status: DC

## 2020-10-26 NOTE — Telephone Encounter (Signed)
External appeals has been submitted for Eliquis. Pt had 1 month supply of Eliquis filled on 3/23 at [facility] pharmacy, I have [facility] in [facility] and activated him $10/month copay card (ID [id number]) while we wait for external appeals decision (can take up to 45 days).

## 2020-10-27 NOTE — Telephone Encounter (Signed)
Received notification that [facility] does not process pt's external appeal. They did not provide me with the contact info for where to submit the request. We do not have pt's [facility] card scanned into Epic to know what phone # to call. Spoke with pt and he doesn't think he even has a Advice worker card at home. He isn't sure that he'll be on Eliquis in the long term but if he is, states he'd prefer to get his meds through the [facility]. He sees Acupuncturist for follow up on 4/26, will reassess need for long term Eliquis at that time.

## 2020-10-31 ENCOUNTER — Other Ambulatory Visit: Payer: Self-pay

## 2020-10-31 ENCOUNTER — Ambulatory Visit (HOSPITAL_COMMUNITY): Payer: 59 | Attending: Cardiovascular Disease

## 2020-10-31 DIAGNOSIS — I480 Paroxysmal atrial fibrillation: Secondary | ICD-10-CM | POA: Diagnosis not present

## 2020-10-31 LAB — ECHOCARDIOGRAM COMPLETE
Area-P 1/2: 3.21 cm2
S' Lateral: 3.2 cm

## 2020-10-31 MED ORDER — APIXABAN 5 MG PO TABS: 1.0000 | ORAL_TABLET | Freq: Two times a day (BID) | ORAL | 5 refills | Status: DC

## 2020-11-06 NOTE — Progress Notes (Addendum)
Cardiology Office Note:    Date:  11/07/2020   ID:  Jimmy Woods, DOB [birth year], MRN [id number]  PCP:  Vick Frees, DO   [facility]  Cardiologist:  Donato Schultz, MD  Advanced Practice Provider:  Beatrice Lecher, PA-C Electrophysiologist:  None   Referring MD: Wallace Cullens, MD   Reason for visit: F/U A-fib hospitalization   History of Present Illness:    Jimmy Woods is a 40 y.o. 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  He presented to the hospital with sudden onset of palpitations, weakness & an episode of N/V.  Denied ETOH/drug use.   Very minimal amounts of caffeine.  Electrolytes normal.  He was started on IV diltiazem and converted overnight. He was started on Eliquis x 1 month given cardioversion and CHADSVASC score of 0.  2D echo 10/31/20 with normal EF, no significant valve disease.   Today, he comes to clinic alone.  He has been feeling well.  No recurrent a-fib episodes from what he can tell.  He bought a Dance movement psychotherapist which has a ECG feature, documenting NSR on his checks.  He occasionally has a "flip flop/skipped beat" feeling ~ once/month.  Denies CP, SOB, LE edema, lightheadness & syncope.  No bleeding issues on Eliquis, though he frequently forgets a dose.  He denies significant ETOH/caffeine use, drug use & OSA symptoms.  He is tolerating diltiazem well.    Past Medical History:  Diagnosis Date  . New onset atrial fibrillation (HCC) 10/04/2020    Past Surgical History:  Procedure Laterality Date  . BACK SURGERY      Current Medications: Current Meds  Medication Sig  . diltiazem (CARDIZEM CD) 120 MG 24 hr capsule Take 1 capsule (120 mg total) by mouth daily.  Marland Kitchen FLUoxetine (PROZAC) 10 MG capsule Take 10 mg by mouth daily.  . melatonin 3 MG TABS tablet Take 3 mg by mouth at bedtime as needed for sleep.  .  SUMAtriptan (IMITREX) 20 MG/ACT nasal spray Place 20 mg into the nose every 2 (two) hours as needed for migraine. SPRAY 1 SPRAY INTO NOSTRIL TWICE A DAY AS NEEDED AS DIRECTED FOR MIGRAINE RELIEF  . [DISCONTINUED] apixaban (ELIQUIS) 5 MG TABS tablet TAKE 1 TABLET (5 MG TOTAL) BY MOUTH TWO TIMES DAILY.     Allergies:   Sertraline, Bupropion, and Morphine and related   Social History   Socioeconomic History  . Marital status: Married    Spouse name: Not on file  . Number of children: Not on file  . Years of education: Not on file  . Highest education level: Not on file  Occupational History  . Not on file  Tobacco Use  . Smoking status: Never Smoker  . Smokeless tobacco: Never Used  Vaping Use  . Vaping Use: Never used  Substance and Sexual Activity  . Alcohol use: Yes    Comment: rare  . Drug use: Never  . Sexual activity: Not on file  Other Topics Concern  . Not on file  Social History Narrative  . Not on file   Social Determinants of Health   Financial Resource Strain: Not on file  Food Insecurity: Not on file  Transportation Needs: Not on file  Physical Activity: Not on file  Stress: Not on file  Social Connections: Not on file     Family History: The patient's family history  includes Heart attack in his father.-- father had PCI at age 48.  Mother was told she had a silent heart attack.  Paternal grandfather died from heart attack in his late 50s/early 25s.  ROS:   Please see the history of present illness.     EKGs/Labs/Other Studies Reviewed:    The following studies were reviewed today: Echo 10/2020 with normal LV/RV & no significant valve disease.  EKG:  EKG is ordered today.  The ekg ordered today demonstrates NSR rate 72, normal axis, PR , QRS 92ms, QTc .    Recent Labs: 10/03/2020: Magnesium 2.2; TSH 1.348 10/04/2020: BUN 14; Creatinine, Ser 1.14; Hemoglobin 18.2; Platelets 229; Potassium 4.2; Sodium 137  Recent Lipid Panel    Component Value  Date/Time   CHOL 190 10/04/2020 0223   TRIG 190 (H) 10/04/2020 0223   HDL 30 (L) 10/04/2020 0223   CHOLHDL 6.3 10/04/2020 0223   VLDL 38 10/04/2020 0223   LDLCALC 122 (H) 10/04/2020 0223     Risk Assessment/Calculations:    CHA2DS2-VASc Score = 0  This indicates a 0.2% annual risk of stroke. The patient's score is based upon: CHF History: No HTN History: No Diabetes History: No Stroke History: No Vascular Disease History: No Age Score: 0 Gender Score: 0       Physical Exam:    VS:  BP 102/62   Pulse 78   Ht  (1.702 m)   Wt 192 lb 3.2 oz (87.2 kg)   SpO2 96%   BMI 30.10 kg/m     Wt Readings from Last 3 Encounters:  11/07/20 192 lb 3.2 oz (87.2 kg)     GEN:  Well nourished, well developed in no acute distress HEENT: Normal NECK: No JVD; No carotid bruits LYMPHATICS: No lymphadenopathy CARDIAC: RRR, no murmurs, rubs, gallops RESPIRATORY:  Clear to auscultation without rales, wheezing or rhonchi  ABDOMEN: Soft, non-tender, non-distended MUSCULOSKELETAL:  No edema; No deformity  SKIN: Warm and dry NEUROLOGIC:  Alert and oriented x 3 PSYCHIATRIC:  Normal affect   ASSESSMENT AND PLAN   1. Paroxysmal atrial fibrillation (HCC), without recurrence --- No recurrence per symptoms, smartwatch.  EKG with NSR today. --- Stop Eliquis with CHADSVASC of 0. --- Continue Cardizem CD  daily.   --- F/U Tereso Newcomer in 3 months.    We discussed options of (1) Stop Cardizem & monitor for symptoms, (2) Continue cardizem & monitor symptoms, (3) Refer to EP & discuss ablation.  We agree with continuing cardizem at this time.  If he has recurrence, would recommend EP consult.  Discussed avoiding a-fib triggers.  Also discussed overall cardiac risk given family history.  Recommend consideration of calcium score testing to help risk stratification/ lipid treatment decision making.  LDL 122.  [facility]. Advise heart healthy diet/exercise  otherwise.           Medication Adjustments/Labs and Tests Ordered: Current medicines are reviewed at length with the patient today.  Concerns regarding medicines are outlined above.  Orders Placed This Encounter  Procedures  . EKG 12-Lead   No orders of the defined types were placed in this encounter.   Patient Instructions   Medication Instructions:  Your physician has recommended you make the following change in your medication:   1.  Discontinue Eliquis  *If you need a refill on your cardiac medications before your next appointment, please call your pharmacy*   Lab Work: -None  If you have labs (blood  work) drawn today and your tests are completely normal, you will receive your results only by: Marland Kitchen. MyChart Message (if you have MyChart) OR . A paper copy in the mail If you have any lab test that is abnormal or we need to change your treatment, we will call you to review the results.   Testing/Procedures: -None   Follow-Up: At [facility], you and your health needs are our priority.  As part of our continuing mission to provide you with exceptional heart care, we have created designated Provider Care Teams.  These Care Teams include your primary Cardiologist (physician) and Advanced Practice Providers (APPs -  Physician Assistants and Nurse Practitioners) who all work together to provide you with the care you need, when you need it.  We recommend signing up for the patient portal called "MyChart".  Sign up information is provided on this After Visit Summary.  MyChart is used to connect with patients for Virtual Visits (Telemedicine).  Patients are able to view lab/test results, encounter notes, upcoming appointments, etc.  Non-urgent messages can be sent to your provider as well.   To learn more about what you can do with MyChart, go to [email or web address].    Your next appointment:   3 month(s) with Tereso NewcomerScott Weaver, PA on Friday, July 15 @ 8:45 am.   The format  for your next appointment:   In Person  Provider:   Tereso NewcomerScott Weaver, PA-C   Other Instructions [facility] ask [facility] about [facility] testing for cardiac risk stratification.    Heart-Healthy Eating Plan Many factors influence your heart (coronary) health, including eating and exercise habits. Coronary risk increases with abnormal blood fat (lipid) levels. Heart-healthy meal planning includes limiting unhealthy fats, increasing healthy fats, and making other diet and lifestyle changes. What is my plan? Your health care provider may recommend that you:  Limit your fat intake to _________% or less of your total calories each day.  Limit your saturated fat intake to _________% or less of your total calories each day.  Limit the amount of cholesterol in your diet to less than _________ mg per day. What are tips for following this plan? Cooking Cook foods using methods other than frying. Baking, boiling, grilling, and broiling are all good options. Other ways to reduce fat include:  Removing the skin from poultry.  Removing all visible fats from meats.  Steaming vegetables in water or broth. Meal planning  At meals, imagine dividing your plate into fourths: ? Fill one-half of your plate with vegetables and green salads. ? Fill one-fourth of your plate with whole grains. ? Fill one-fourth of your plate with lean protein foods.  Eat 4-5 servings of vegetables per day. One serving equals 1 cup raw or cooked vegetable, or 2 cups raw leafy greens.  Eat 4-5 servings of fruit per day. One serving equals 1 medium whole fruit,  cup dried fruit,  cup fresh, frozen, or canned fruit, or  cup 100% fruit juice.  Eat more foods that contain soluble fiber. Examples include apples, broccoli, carrots, beans, peas, and barley. Aim to get 25-30 g of fiber per day.  Increase your consumption of legumes, nuts, and seeds to 4-5 servings per week. One serving of dried beans or legumes equals  cup cooked, 1  serving of nuts is  cup, and 1 serving of seeds equals 1 tablespoon.   Fats  Choose healthy fats more often. Choose monounsaturated and polyunsaturated fats, such as olive and canola oils, flaxseeds, walnuts, almonds, and  seeds.  Eat more omega-3 fats. Choose salmon, mackerel, sardines, tuna, flaxseed oil, and ground flaxseeds. Aim to eat fish at least 2 times each week.  Check food labels carefully to identify foods with trans fats or high amounts of saturated fat.  Limit saturated fats. These are found in animal products, such as meats, butter, and cream. Plant sources of saturated fats include palm oil, palm kernel oil, and coconut oil.  Avoid foods with partially hydrogenated oils in them. These contain trans fats. Examples are stick margarine, some tub margarines, cookies, crackers, and other baked goods.  Avoid fried foods. General information  Eat more home-cooked food and less restaurant, buffet, and fast food.  Limit or avoid alcohol.  Limit foods that are high in starch and sugar.  Lose weight if you are overweight. Losing just 5-10% of your body weight can help your overall health and prevent diseases such as diabetes and heart disease.  Monitor your salt (sodium) intake, especially if you have high blood pressure. Talk with your health care provider about your sodium intake.  Try to incorporate more vegetarian meals weekly. What foods can I eat? Fruits All fresh, canned (in natural juice), or frozen fruits. Vegetables Fresh or frozen vegetables (raw, steamed, roasted, or grilled). Green salads. Grains Most grains. Choose whole wheat and whole grains most of the time. Rice and pasta, including brown rice and pastas made with whole wheat. Meats and other proteins Lean, well-trimmed beef, veal, pork, and lamb. Chicken and [place] without skin. All fish and shellfish. Wild duck, rabbit, pheasant, and venison. Egg whites or low-cholesterol egg substitutes. Dried beans, peas,  lentils, and tofu. Seeds and most nuts. Dairy Low-fat or nonfat cheeses, including ricotta and mozzarella. Skim or 1% milk (liquid, powdered, or evaporated). Buttermilk made with low-fat milk. Nonfat or low-fat yogurt. Fats and oils Non-hydrogenated (trans-free) margarines. Vegetable oils, including soybean, sesame, sunflower, olive, peanut, safflower, corn, canola, and cottonseed. Salad dressings or mayonnaise made with a vegetable oil. Beverages Water (mineral or sparkling). Coffee and tea. Diet carbonated beverages. Sweets and desserts Sherbet, gelatin, and fruit ice. Small amounts of dark chocolate. Limit all sweets and desserts. Seasonings and condiments All seasonings and condiments. The items listed above may not be a complete list of foods and beverages you can eat. Contact a dietitian for more options. What foods are not recommended? Fruits Canned fruit in heavy syrup. Fruit in cream or butter sauce. Fried fruit. Limit coconut. Vegetables Vegetables cooked in cheese, cream, or butter sauce. Fried vegetables. Grains Breads made with saturated or trans fats, oils, or whole milk. Croissants. Sweet rolls. Donuts. High-fat crackers, such as cheese crackers. Meats and other proteins Fatty meats, such as hot dogs, ribs, sausage, bacon, rib-eye roast or steak. High-fat deli meats, such as salami and bologna. Caviar. Domestic duck and goose. Organ meats, such as liver. Dairy Cream, sour cream, cream cheese, and creamed cottage cheese. Whole milk cheeses. Whole or 2% milk (liquid, evaporated, or condensed). Whole buttermilk. Cream sauce or high-fat cheese sauce. Whole-milk yogurt. Fats and oils Meat fat, or shortening. Cocoa butter, hydrogenated oils, palm oil, coconut oil, palm kernel oil. Solid fats and shortenings, including bacon fat, salt pork, lard, and butter. Nondairy cream substitutes. Salad dressings with cheese or sour cream. Beverages Regular sodas and any drinks with added  sugar. Sweets and desserts Frosting. Pudding. Cookies. Cakes. Pies. Milk chocolate or white chocolate. Buttered syrups. Full-fat ice cream or ice cream drinks. The items listed above may not be a complete  list of foods and beverages to avoid. Contact a dietitian for more information. Summary  Heart-healthy meal planning includes limiting unhealthy fats, increasing healthy fats, and making other diet and lifestyle changes.  Lose weight if you are overweight. Losing just 5-10% of your body weight can help your overall health and prevent diseases such as diabetes and heart disease.  Focus on eating a balance of foods, including fruits and vegetables, low-fat or nonfat dairy, lean protein, nuts and legumes, whole grains, and heart-healthy oils and fats. This information is not intended to replace advice given to you by your health care provider. Make sure you discuss any questions you have with your health care provider. Document Revised: 08/08/2017 Document Reviewed: 08/08/2017 Elsevier Patient Education  [address]      Signed, Bernette Mayers  11/07/2020 9:36 AM    [facility]

## 2020-11-07 ENCOUNTER — Ambulatory Visit (INDEPENDENT_AMBULATORY_CARE_PROVIDER_SITE_OTHER): Payer: 59 | Admitting: Physician Assistant

## 2020-11-07 ENCOUNTER — Encounter: Payer: Self-pay | Admitting: Physician Assistant

## 2020-11-07 ENCOUNTER — Other Ambulatory Visit: Payer: Self-pay

## 2020-11-07 VITALS — BP 102/62 | HR 78 | Ht 67.0 in | Wt 192.2 lb

## 2020-11-07 DIAGNOSIS — I480 Paroxysmal atrial fibrillation: Secondary | ICD-10-CM | POA: Diagnosis not present

## 2020-11-07 NOTE — Patient Instructions (Addendum)
Medication Instructions:  Your physician has recommended you make the following change in your medication:   1.  Discontinue Eliquis  *If you need a refill on your cardiac medications before your next appointment, please call your pharmacy*   Lab Work: -None  If you have labs (blood work) drawn today and your tests are completely normal, you will receive your results only by: Marland Kitchen MyChart Message (if you have MyChart) OR . A paper copy in the mail If you have any lab test that is abnormal or we need to change your treatment, we will call you to review the results.   Testing/Procedures: -None   Follow-Up: At [facility], you and your health needs are our priority.  As part of our continuing mission to provide you with exceptional heart care, we have created designated Provider Care Teams.  These Care Teams include your primary Cardiologist (physician) and Advanced Practice Providers (APPs -  Physician Assistants and Nurse Practitioners) who all work together to provide you with the care you need, when you need it.  We recommend signing up for the patient portal called "MyChart".  Sign up information is provided on this After Visit Summary.  MyChart is used to connect with patients for Virtual Visits (Telemedicine).  Patients are able to view lab/test results, encounter notes, upcoming appointments, etc.  Non-urgent messages can be sent to your provider as well.   To learn more about what you can do with MyChart, go to [email or web address].    Your next appointment:   3 month(s) with Tereso Newcomer, PA on Friday, July 15 @ 8:45 am.   The format for your next appointment:   In Person  Provider:   Tereso Newcomer, PA-C   Other Instructions [facility] ask [facility] about [facility] for cardiac risk stratification.    Heart-Healthy Eating Plan Many factors influence your heart (coronary) health, including eating and exercise habits. Coronary risk increases with abnormal blood fat  (lipid) levels. Heart-healthy meal planning includes limiting unhealthy fats, increasing healthy fats, and making other diet and lifestyle changes. What is my plan? Your health care provider may recommend that you:  Limit your fat intake to _________% or less of your total calories each day.  Limit your saturated fat intake to _________% or less of your total calories each day.  Limit the amount of cholesterol in your diet to less than _________ mg per day. What are tips for following this plan? Cooking Cook foods using methods other than frying. Baking, boiling, grilling, and broiling are all good options. Other ways to reduce fat include:  Removing the skin from poultry.  Removing all visible fats from meats.  Steaming vegetables in water or broth. Meal planning  At meals, imagine dividing your plate into fourths: ? Fill one-half of your plate with vegetables and green salads. ? Fill one-fourth of your plate with whole grains. ? Fill one-fourth of your plate with lean protein foods.  Eat 4-5 servings of vegetables per day. One serving equals 1 cup raw or cooked vegetable, or 2 cups raw leafy greens.  Eat 4-5 servings of fruit per day. One serving equals 1 medium whole fruit,  cup dried fruit,  cup fresh, frozen, or canned fruit, or  cup 100% fruit juice.  Eat more foods that contain soluble fiber. Examples include apples, broccoli, carrots, beans, peas, and barley. Aim to get 25-30 g of fiber per day.  Increase your consumption of legumes, nuts, and seeds to 4-5 servings per week. One  serving of dried beans or legumes equals  cup cooked, 1 serving of nuts is  cup, and 1 serving of seeds equals 1 tablespoon.   Fats  Choose healthy fats more often. Choose monounsaturated and polyunsaturated fats, such as olive and canola oils, flaxseeds, walnuts, almonds, and seeds.  Eat more omega-3 fats. Choose salmon, mackerel, sardines, tuna, flaxseed oil, and ground flaxseeds. Aim to eat  fish at least 2 times each week.  Check food labels carefully to identify foods with trans fats or high amounts of saturated fat.  Limit saturated fats. These are found in animal products, such as meats, butter, and cream. Plant sources of saturated fats include palm oil, palm kernel oil, and coconut oil.  Avoid foods with partially hydrogenated oils in them. These contain trans fats. Examples are stick margarine, some tub margarines, cookies, crackers, and other baked goods.  Avoid fried foods. General information  Eat more home-cooked food and less restaurant, buffet, and fast food.  Limit or avoid alcohol.  Limit foods that are high in starch and sugar.  Lose weight if you are overweight. Losing just 5-10% of your body weight can help your overall health and prevent diseases such as diabetes and heart disease.  Monitor your salt (sodium) intake, especially if you have high blood pressure. Talk with your health care provider about your sodium intake.  Try to incorporate more vegetarian meals weekly. What foods can I eat? Fruits All fresh, canned (in natural juice), or frozen fruits. Vegetables Fresh or frozen vegetables (raw, steamed, roasted, or grilled). Green salads. Grains Most grains. Choose whole wheat and whole grains most of the time. Rice and pasta, including brown rice and pastas made with whole wheat. Meats and other proteins Lean, well-trimmed beef, veal, pork, and lamb. Chicken and [place] without skin. All fish and shellfish. Wild duck, rabbit, pheasant, and venison. Egg whites or low-cholesterol egg substitutes. Dried beans, peas, lentils, and tofu. Seeds and most nuts. Dairy Low-fat or nonfat cheeses, including ricotta and mozzarella. Skim or 1% milk (liquid, powdered, or evaporated). Buttermilk made with low-fat milk. Nonfat or low-fat yogurt. Fats and oils Non-hydrogenated (trans-free) margarines. Vegetable oils, including soybean, sesame, sunflower, olive,  peanut, safflower, corn, canola, and cottonseed. Salad dressings or mayonnaise made with a vegetable oil. Beverages Water (mineral or sparkling). Coffee and tea. Diet carbonated beverages. Sweets and desserts Sherbet, gelatin, and fruit ice. Small amounts of dark chocolate. Limit all sweets and desserts. Seasonings and condiments All seasonings and condiments. The items listed above may not be a complete list of foods and beverages you can eat. Contact a dietitian for more options. What foods are not recommended? Fruits Canned fruit in heavy syrup. Fruit in cream or butter sauce. Fried fruit. Limit coconut. Vegetables Vegetables cooked in cheese, cream, or butter sauce. Fried vegetables. Grains Breads made with saturated or trans fats, oils, or whole milk. Croissants. Sweet rolls. Donuts. High-fat crackers, such as cheese crackers. Meats and other proteins Fatty meats, such as hot dogs, ribs, sausage, bacon, rib-eye roast or steak. High-fat deli meats, such as salami and bologna. Caviar. Domestic duck and goose. Organ meats, such as liver. Dairy Cream, sour cream, cream cheese, and creamed cottage cheese. Whole milk cheeses. Whole or 2% milk (liquid, evaporated, or condensed). Whole buttermilk. Cream sauce or high-fat cheese sauce. Whole-milk yogurt. Fats and oils Meat fat, or shortening. Cocoa butter, hydrogenated oils, palm oil, coconut oil, palm kernel oil. Solid fats and shortenings, including bacon fat, salt pork, lard, and butter. Nondairy  cream substitutes. Salad dressings with cheese or sour cream. Beverages Regular sodas and any drinks with added sugar. Sweets and desserts Frosting. Pudding. Cookies. Cakes. Pies. Milk chocolate or white chocolate. Buttered syrups. Full-fat ice cream or ice cream drinks. The items listed above may not be a complete list of foods and beverages to avoid. Contact a dietitian for more information. Summary  Heart-healthy meal planning includes  limiting unhealthy fats, increasing healthy fats, and making other diet and lifestyle changes.  Lose weight if you are overweight. Losing just 5-10% of your body weight can help your overall health and prevent diseases such as diabetes and heart disease.  Focus on eating a balance of foods, including fruits and vegetables, low-fat or nonfat dairy, lean protein, nuts and legumes, whole grains, and heart-healthy oils and fats. This information is not intended to replace advice given to you by your health care provider. Make sure you discuss any questions you have with your health care provider. Document Revised: 08/08/2017 Document Reviewed: 08/08/2017 Elsevier Patient Education  2021 [employer].

## 2020-12-05 ENCOUNTER — Encounter: Payer: Self-pay | Admitting: *Deleted

## 2021-01-26 ENCOUNTER — Ambulatory Visit: Payer: 59 | Admitting: Physician Assistant

## 2021-01-29 NOTE — Progress Notes (Signed)
Cardiology Office Note:    Date:  01/30/2021   ID:  Jimmy Woods, DOB [birth year], MRN [id number]  PCP:  Vick Frees, DO   [facility] Providers Cardiologist:  Donato Schultz, MD Cardiology APP:  Kennon Rounds     Referring MD: Vick Frees*   Chief Complaint:  Follow-up (Atrial fibrillation)    Patient Profile:    Jimmy Woods is a 40 y.o. male with:  Paroxysmal atrial fibrillation  Admit 3/22 AF w RVR >> conv to NSR on IV Dilt CHA2DS2-VASc Score = 0     Migraine HAs Spinal injury s/p surgery (service connected)   Prior CV studies: Echocardiogram 10/31/20 EF 62, no RWMA, normal RVSF, trivial MR   History of Present Illness: Jimmy Woods was last seen in 4/22 by Juanda Crumble, PA-C.  His Apixaban was stopped at that time as his CHA2DS2-VASc Score = 0 [CHF History: No, HTN History: No, Diabetes History: No, Stroke History: No, Vascular Disease History: No, Age Score: 0, Gender Score: 0].  Therefore, the patient's annual risk of stroke is 0.2 %.    He returns for f/u.  He is here alone.  He has not had any symptoms of recurrent atrial fibrillation since his last visit.  He has not had chest pain, shortness of breath, syncope, leg edema.  He is tolerating diltiazem.    Past Medical History:  Diagnosis Date   AF (paroxysmal atrial fibrillation) (HCC) 10/04/2020   Admit 3/22 AF w RVR >> conv to NSR on IV Dilt // CHA2DS2-VASc Score = 0 (not on anticoag)   History of back injury    Hx of migraine headaches     Current Medications: Current Meds  Medication Sig   diltiazem (CARDIZEM CD) 120 MG 24 hr capsule Take 1 capsule (120 mg total) by mouth daily.   FLUoxetine (PROZAC) 10 MG capsule Take 10 mg by mouth daily.   melatonin 3 MG TABS tablet Take 3 mg by mouth at bedtime as needed for sleep.     Allergies:   Sertraline, Bupropion, and Morphine and related   Social History   Tobacco Use   Smoking status: Never   Smokeless  tobacco: Never  Vaping Use   Vaping Use: Never used  Substance Use Topics   Alcohol use: Yes    Comment: rare   Drug use: Never     Family Hx: The patient's family history includes Heart attack in his father and paternal grandfather.  ROS   EKGs/Labs/Other Test Reviewed:    EKG:  EKG is not ordered today.  The ekg ordered today demonstrates N/A  Recent Labs: 10/03/2020: Magnesium 2.2; TSH 1.348 10/04/2020: BUN 14; Creatinine, Ser 1.14; Hemoglobin 18.2; Platelets 229; Potassium 4.2; Sodium 137   Recent Lipid Panel Lab Results  Component Value Date/Time   CHOL 190 10/04/2020 02:23 AM   TRIG 190 (H) 10/04/2020 02:23 AM   HDL 30 (L) 10/04/2020 02:23 AM   LDLCALC 122 (H) 10/04/2020 02:23 AM      Risk Assessment/Calculations:    CHA2DS2-VASc Score = 0  This indicates a 0.2% annual risk of stroke. The patient's score is based upon: CHF History: No HTN History: No Diabetes History: No Stroke History: No Vascular Disease History: No Age Score: 0 Gender Score: 0      Physical Exam:    VS:  BP 102/70   Pulse 87   Ht 5\' 7"  (1.702 m)   Wt 196 lb 9.6 oz (89.2 kg)  SpO2 97%   BMI 30.79 kg/m     Wt Readings from Last 3 Encounters:  01/30/21 196 lb 9.6 oz (89.2 kg)  11/07/20 192 lb 3.2 oz (87.2 kg)     Constitutional:      Appearance: Healthy appearance. Not in distress.  Pulmonary:     Effort: Pulmonary effort is normal.     Breath sounds: No wheezing. No rales.  Cardiovascular:     Normal rate. Regular rhythm. Normal S1. Normal S2.      Murmurs: There is no murmur.  Edema:    Peripheral edema absent.  Abdominal:     Palpations: Abdomen is soft.  Musculoskeletal:     Cervical back: Neck supple. Skin:    General: Skin is warm and dry.  Neurological:     General: No focal deficit present.     Mental Status: Alert and oriented to person, place and time.     Cranial Nerves: Cranial nerves are intact.       ASSESSMENT & PLAN:    1. Paroxysmal atrial  fibrillation (HCC) Maintaining sinus rhythm.  He is no longer on anticoagulation as he has a very low risk for stroke.  He has a smart watch and has been monitoring his rhythm since last visit.  He has not had any episodes of atrial fibrillation.  At this point, we will continue current therapy.  I have asked him to follow-up in 1 year with Dr. Anne Fu or me.  If he has any episodes of atrial fibrillation between now and then, we can see him sooner.  If he has recurrent atrial fibrillation,  we should consider referral to EP for consideration of PVI ablation.      Dispo:  Return in about 1 year (around 01/30/2022) for Routine Follow Up with Dr. Anne Fu, or Tereso Newcomer, PA-C.   Medication Adjustments/Labs and Tests Ordered: Current medicines are reviewed at length with the patient today.  Concerns regarding medicines are outlined above.  Tests Ordered: No orders of the defined types were placed in this encounter.  Medication Changes: No orders of the defined types were placed in this encounter.   Signed, Tereso Newcomer, PA-C  01/30/2021 9:05 AM    [facility] [address] [place], [place], [place]  [address] Phone: [phone]; Fax: [phone]

## 2021-01-30 ENCOUNTER — Encounter: Payer: Self-pay | Admitting: Physician Assistant

## 2021-01-30 ENCOUNTER — Other Ambulatory Visit: Payer: Self-pay

## 2021-01-30 ENCOUNTER — Ambulatory Visit (INDEPENDENT_AMBULATORY_CARE_PROVIDER_SITE_OTHER): Payer: 59 | Admitting: Physician Assistant

## 2021-01-30 VITALS — BP 102/70 | HR 87 | Ht 67.0 in | Wt 196.6 lb

## 2021-01-30 DIAGNOSIS — I480 Paroxysmal atrial fibrillation: Secondary | ICD-10-CM

## 2021-01-30 NOTE — Patient Instructions (Signed)
Medication Instructions:   Your physician recommends that you continue on your current medications as directed. Please refer to the Current Medication list given to you today.  *If you need a refill on your cardiac medications before your next appointment, please call your pharmacy*   Lab Work:  -None  If you have labs (blood work) drawn today and your tests are completely normal, you will receive your results only by: MyChart Message (if you have MyChart) OR A paper copy in the mail If you have any lab test that is abnormal or we need to change your treatment, we will call you to review the results.   Testing/Procedures:  -None   Follow-Up: At [facility], you and your health needs are our priority.  As part of our continuing mission to provide you with exceptional heart care, we have created designated Provider Care Teams.  These Care Teams include your primary Cardiologist (physician) and Advanced Practice Providers (APPs -  Physician Assistants and Nurse Practitioners) who all work together to provide you with the care you need, when you need it.  We recommend signing up for the patient portal called "MyChart".  Sign up information is provided on this After Visit Summary.  MyChart is used to connect with patients for Virtual Visits (Telemedicine).  Patients are able to view lab/test results, encounter notes, upcoming appointments, etc.  Non-urgent messages can be sent to your provider as well.   To learn more about what you can do with MyChart, go to [email or web address].    Your next appointment:   1 year(s)  The format for your next appointment:   In Person  Provider:   You may see Donato Schultz, MD or one of the following Advanced Practice Providers on your designated Care Team: Tereso Newcomer, PA-C   Other Instructions Your physician wants you to follow-up in: 1 year with Dr. Anne Fu or Tereso Newcomer, PA-C.  You will receive a reminder letter in the mail two months in  advance. If you don't receive a letter, please call our office to schedule the follow-up appointment.

## 2022-11-06 IMAGING — DX DG CHEST 2V
2 series · 2 of 2 positions shown · non-contrast
Comparison: None.

CLINICAL DATA: Chest pain

EXAM:
CHEST - 2 VIEW

[w chest pa]
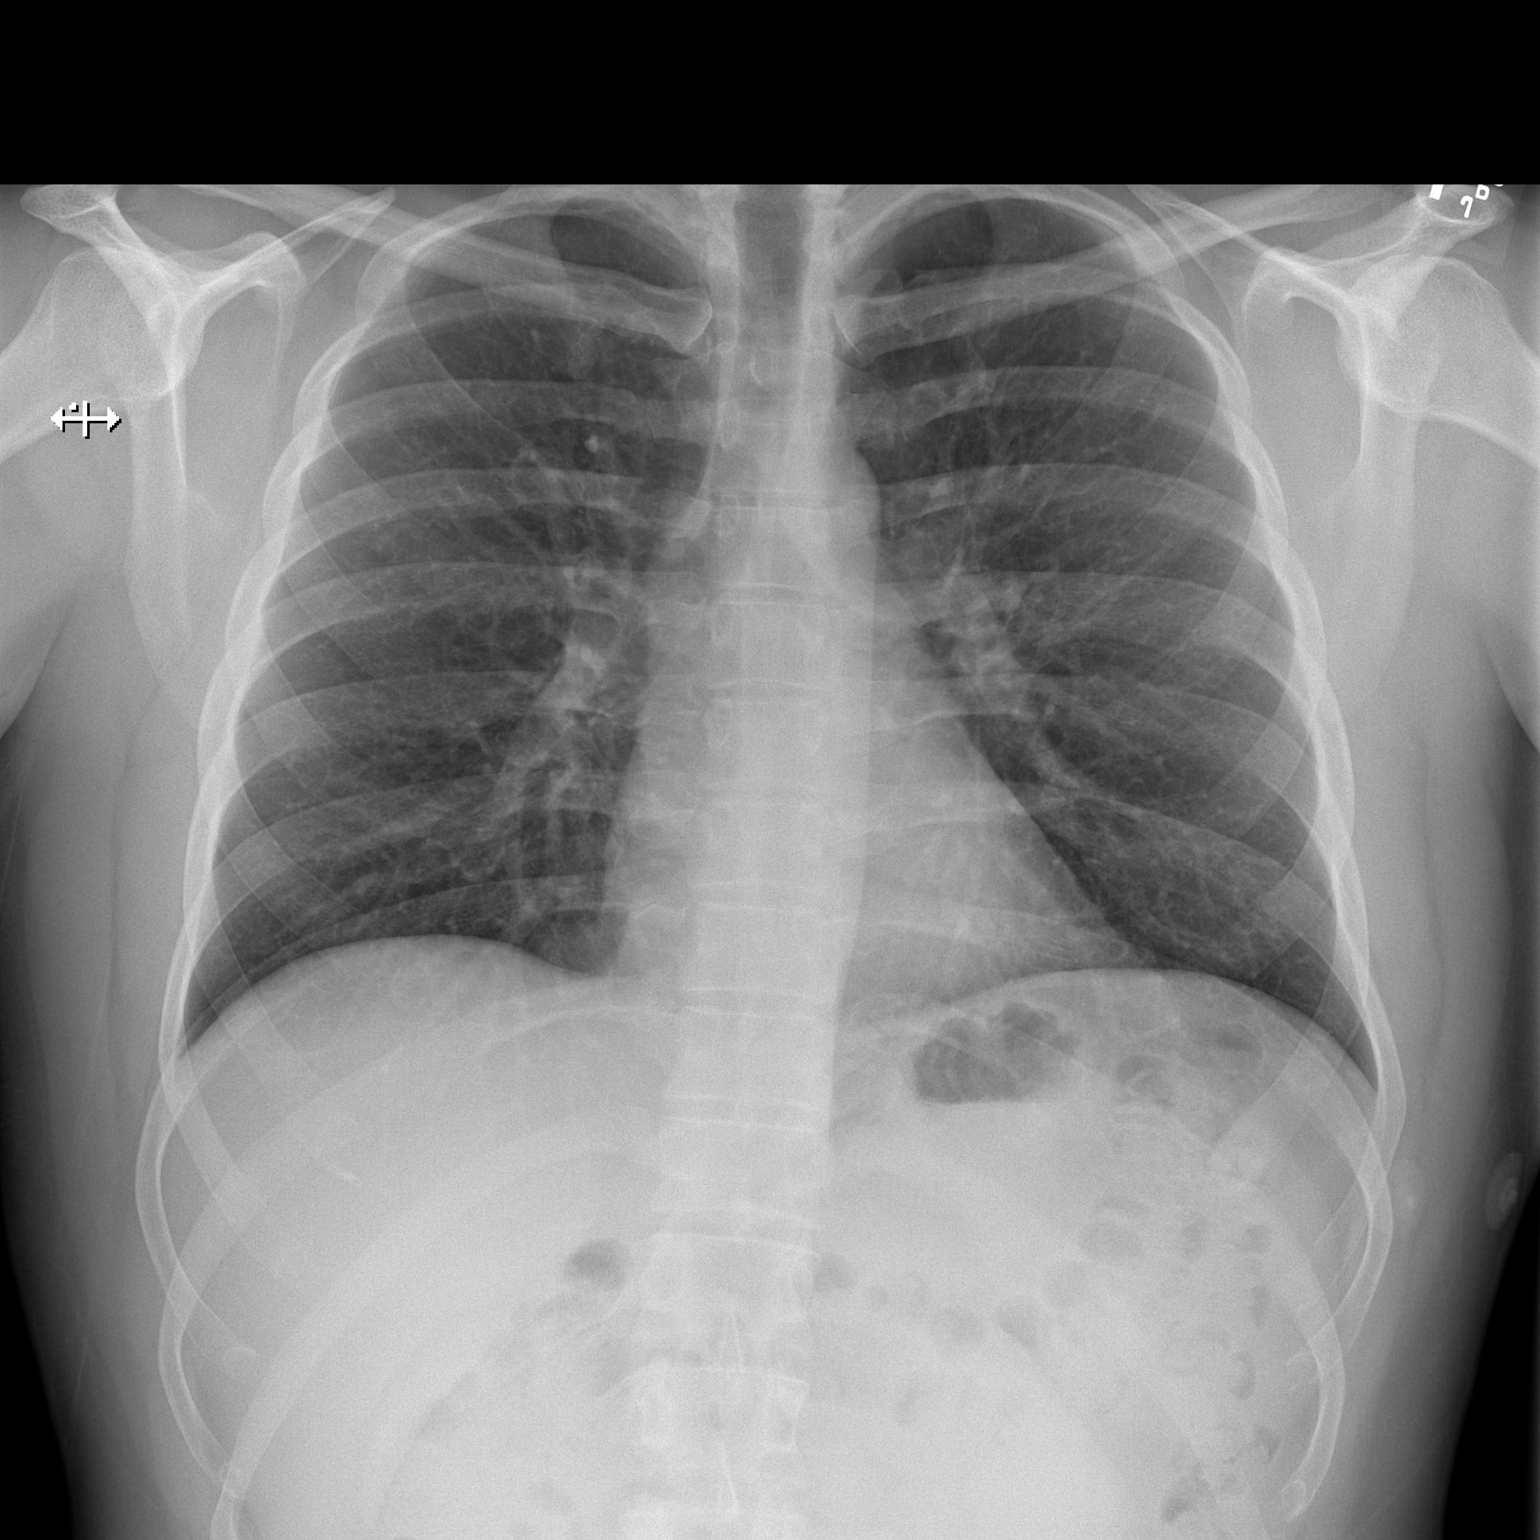

[w chest lat]
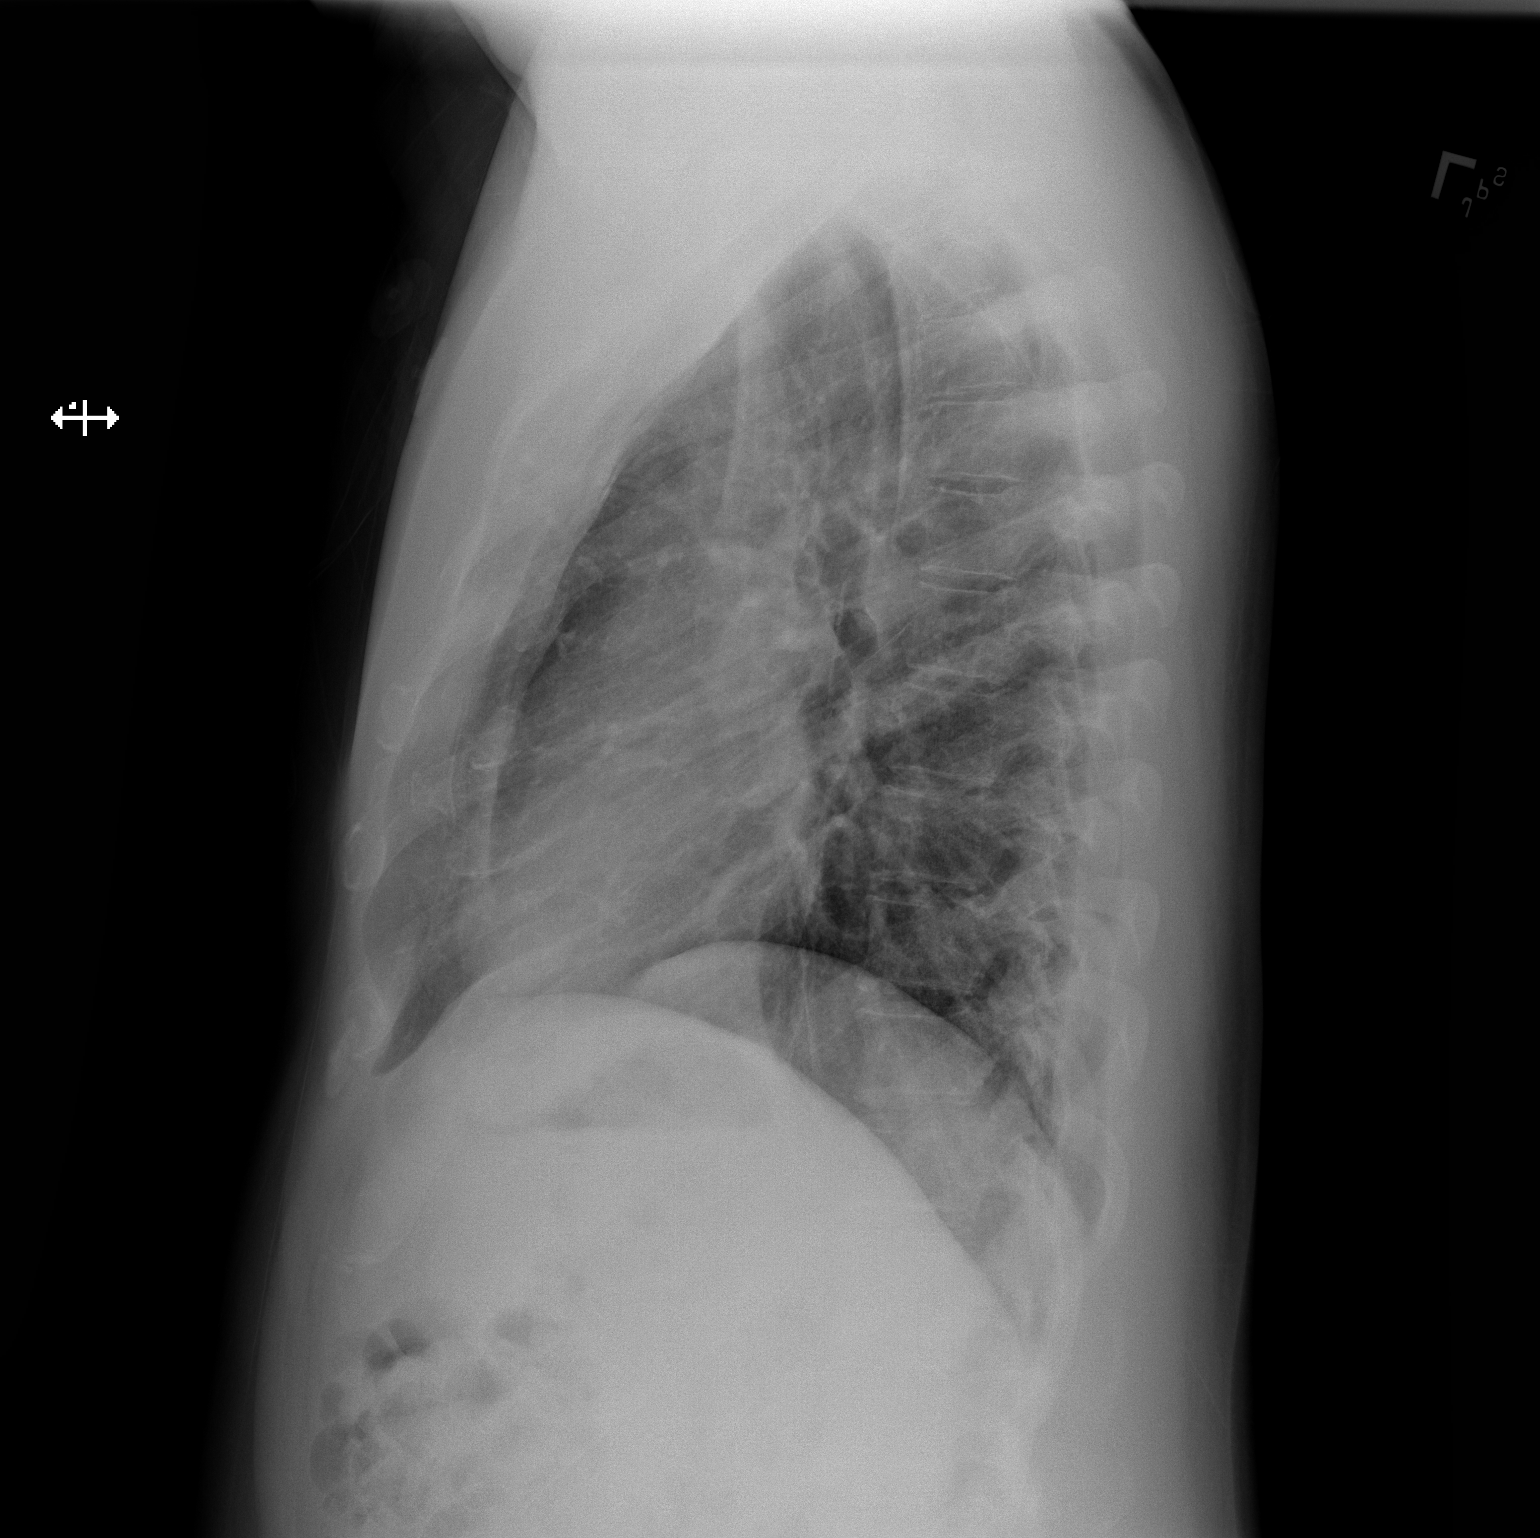

[2 of 2 positions shown; findings below may reference images not displayed]

FINDINGS: Lungs are clear.  No pleural effusion or pneumothorax.

The heart is normal in size.

Visualized osseous structures are within normal limits.
IMPRESSION: Normal chest radiographs.
